# Patient Record
Sex: Male | Born: 1975 | Race: Black or African American | Hispanic: No | Marital: Single | Smoking: Former smoker
Health system: Southern US, Community
[De-identification: ages and names within clinical notes are randomized; demographics above are authoritative.]

## PROBLEM LIST (undated history)

## (undated) DIAGNOSIS — I10 Essential (primary) hypertension: Secondary | ICD-10-CM

## (undated) HISTORY — PX: DG THUMB LEFT HAND: HXRAD1658

---

## 2015-08-30 ENCOUNTER — Emergency Department (HOSPITAL_COMMUNITY)

## 2015-08-30 ENCOUNTER — Emergency Department (HOSPITAL_COMMUNITY)
Admission: EM | Admit: 2015-08-30 | Discharge: 2015-08-30 | Disposition: A | Discharge status: Home / Self Care | Attending: Emergency Medicine | Admitting: Emergency Medicine

## 2015-08-30 ENCOUNTER — Encounter (HOSPITAL_COMMUNITY): Payer: Self-pay | Admitting: Emergency Medicine

## 2015-08-30 DIAGNOSIS — S63251A Unspecified dislocation of left index finger, initial encounter: Secondary | ICD-10-CM | POA: Diagnosis not present

## 2015-08-30 DIAGNOSIS — Z87891 Personal history of nicotine dependence: Secondary | ICD-10-CM | POA: Diagnosis not present

## 2015-08-30 DIAGNOSIS — Y92310 Basketball court as the place of occurrence of the external cause: Secondary | ICD-10-CM | POA: Insufficient documentation

## 2015-08-30 DIAGNOSIS — Y998 Other external cause status: Secondary | ICD-10-CM | POA: Insufficient documentation

## 2015-08-30 DIAGNOSIS — W500XXA Accidental hit or strike by another person, initial encounter: Secondary | ICD-10-CM | POA: Insufficient documentation

## 2015-08-30 DIAGNOSIS — Y9367 Activity, basketball: Secondary | ICD-10-CM | POA: Insufficient documentation

## 2015-08-30 DIAGNOSIS — S6992XA Unspecified injury of left wrist, hand and finger(s), initial encounter: Secondary | ICD-10-CM | POA: Diagnosis present

## 2015-08-30 DIAGNOSIS — T148XXA Other injury of unspecified body region, initial encounter: Secondary | ICD-10-CM

## 2015-08-30 MED ORDER — CEFAZOLIN SODIUM 1-5 GM-% IV SOLN
1.0000 g | Freq: Three times a day (TID) | INTRAVENOUS | Status: DC
  Administered 2015-08-30: 1 g via INTRAVENOUS
  Filled 2015-08-30: qty 50

## 2015-08-30 MED ORDER — IBUPROFEN 600 MG PO TABS: 600.0000 mg | ORAL_TABLET | Freq: Four times a day (QID) | ORAL | Status: DC | PRN

## 2015-08-30 MED ORDER — HYDROCODONE-ACETAMINOPHEN 5-325 MG PO TABS: 1.0000 | ORAL_TABLET | ORAL | Status: DC | PRN

## 2015-08-30 MED ORDER — FENTANYL CITRATE (PF) 100 MCG/2ML IJ SOLN
50.0000 ug | Freq: Once | INTRAMUSCULAR | Status: AC
  Administered 2015-08-30: 50 ug via INTRAVENOUS

## 2015-08-30 MED ORDER — CEPHALEXIN 500 MG PO CAPS: 500.0000 mg | ORAL_CAPSULE | Freq: Four times a day (QID) | ORAL | Status: DC

## 2015-08-30 MED ORDER — FENTANYL CITRATE (PF) 100 MCG/2ML IJ SOLN
INTRAMUSCULAR | Status: AC
  Filled 2015-08-30: qty 2

## 2015-08-30 MED ORDER — BUPIVACAINE HCL (PF) 0.5 % IJ SOLN
10.0000 mL | Freq: Once | INTRAMUSCULAR | Status: DC
  Filled 2015-08-30: qty 30

## 2015-08-30 NOTE — Discharge Instructions (Signed)
Finger Dislocation °Finger dislocation is the displacement of bones in your finger at the joints. Most commonly, finger dislocation occurs at the proximal interphalangeal joint (the joint closest to your knuckle). Very strong, fibrous tissues (ligaments) and joint capsules connect the three bones of your fingers.  °CAUSES °Dislocation is caused by a forceful impact. This impact moves these bones off the joint and often tears your ligaments.  °SYMPTOMS °Symptoms of finger dislocation include: °· Deformity of your finger. °· Pain, with loss of movement. °DIAGNOSIS  °Finger dislocation is diagnosed with a physical exam. Often, X-ray exams are done to see if you have associated injuries, such as bone fractures. °TREATMENT  °Finger dislocations are treated by putting your bones back into position (reduction) either by manually moving the bones back into place or through surgery. Your finger is then kept in a fixed position (immobilized) with the use of a dressing or splint for a brief period. °When your ligament has to be surgically repaired, it needs to be kept in a fixed position with a dressing or splint for 1 to 2 weeks. Because joint stiffness is a long-term complication of finger dislocation, hand exercises or physical therapy to increase the range of motion and to regain strength is usually started as soon as the ligament is healed. Exercises and therapy generally last no more than 3 months. °HOME CARE INSTRUCTIONS °The following measures can help to reduce pain and speed up the healing process: °· Rest your injured joint. Do not move until instructed otherwise by your caregiver. Avoid activities similar to the one that caused your injury. °· Apply ice to your injured joint for the first day or 2 after your reduction or as directed by your caregiver. Applying ice helps to reduce inflammation and pain. °¨ Put ice in a plastic bag. °¨ Place a towel between your skin and the bag. °¨ Leave the ice on for 15-20 minutes  at a time, every 2 hours while you are awake. °· Elevate your hand above your heart as directed by your caregiver to reduce swelling. °· Take over-the-counter or prescription medicine for pain as your caregiver instructs you. °SEEK IMMEDIATE MEDICAL CARE IF: °· Your dressing or splint becomes damaged. °· Your pain becomes worse rather than better. °· You lose feeling in your finger, or it becomes cold and white. °MAKE SURE YOU: °· Understand these instructions. °· Will watch your condition. °· Will get help right away if you are not doing well or get worse. °Document Released: 12/01/2000 Document Revised: 02/26/2012 Document Reviewed: 09/24/2011 °[employer]® Patient Information ©2015 [employer]. This information is not intended to replace advice given to you by your health care provider. Make sure you discuss any questions you have with your health care provider. ° °Laceration Care, Adult °A laceration is a cut or lesion that goes through all layers of the skin and into the tissue just beneath the skin. °TREATMENT  °Some lacerations may not require closure. Some lacerations may not be able to be closed due to an increased risk of infection. It is important to see your caregiver as soon as possible after an injury to minimize the risk of infection and maximize the opportunity for successful closure. °If closure is appropriate, pain medicines may be given, if needed. The wound will be cleaned to help prevent infection. Your caregiver will use stitches (sutures), staples, wound glue (adhesive), or skin adhesive strips to repair the laceration. These tools bring the skin edges together to allow for faster healing and   a better cosmetic outcome. However, all wounds will heal with a scar. Once the wound has healed, scarring can be minimized by covering the wound with sunscreen during the day for 1 full year. °HOME CARE INSTRUCTIONS  °For sutures or staples: °· Keep the wound clean and dry. °· If you were given a bandage  (dressing), you should change it at least once a day. Also, change the dressing if it becomes wet or dirty, or as directed by your caregiver. °· Wash the wound with soap and water 2 times a day. Rinse the wound off with water to remove all soap. Pat the wound dry with a clean towel. °· After cleaning, apply a thin layer of the antibiotic ointment as recommended by your caregiver. This will help prevent infection and keep the dressing from sticking. °· You may shower as usual after the first 24 hours. Do not soak the wound in water until the sutures are removed. °· Only take over-the-counter or prescription medicines for pain, discomfort, or fever as directed by your caregiver. °· Get your sutures or staples removed as directed by your caregiver. °For skin adhesive strips: °· Keep the wound clean and dry. °· Do not get the skin adhesive strips wet. You may bathe carefully, using caution to keep the wound dry. °· If the wound gets wet, pat it dry with a clean towel. °· Skin adhesive strips will fall off on their own. You may trim the strips as the wound heals. Do not remove skin adhesive strips that are still stuck to the wound. They will fall off in time. °For wound adhesive: °· You may briefly wet your wound in the shower or bath. Do not soak or scrub the wound. Do not swim. Avoid periods of heavy perspiration until the skin adhesive has fallen off on its own. After showering or bathing, gently pat the wound dry with a clean towel. °· Do not apply liquid medicine, cream medicine, or ointment medicine to your wound while the skin adhesive is in place. This may loosen the film before your wound is healed. °· If a dressing is placed over the wound, be careful not to apply tape directly over the skin adhesive. This may cause the adhesive to be pulled off before the wound is healed. °· Avoid prolonged exposure to sunlight or tanning lamps while the skin adhesive is in place. Exposure to ultraviolet light in the first  year will darken the scar. °· The skin adhesive will usually remain in place for 5 to 10 days, then naturally fall off the skin. Do not pick at the adhesive film. °You may need a tetanus shot if: °· You cannot remember when you had your last tetanus shot. °· You have never had a tetanus shot. °If you get a tetanus shot, your arm may swell, get red, and feel warm to the touch. This is common and not a problem. If you need a tetanus shot and you choose not to have one, there is a rare chance of getting tetanus. Sickness from tetanus can be serious. °SEEK MEDICAL CARE IF:  °· You have redness, swelling, or increasing pain in the wound. °· You see a red line that goes away from the wound. °· You have yellowish-white fluid (pus) coming from the wound. °· You have a fever. °· You notice a bad smell coming from the wound or dressing. °· Your wound breaks open before or after sutures have been removed. °· You notice something coming out of   the wound such as wood or glass. °· Your wound is on your hand or foot and you cannot move a finger or toe. °SEEK IMMEDIATE MEDICAL CARE IF:  °· Your pain is not controlled with prescribed medicine. °· You have severe swelling around the wound causing pain and numbness or a change in color in your arm, hand, leg, or foot. °· Your wound splits open and starts bleeding. °· You have worsening numbness, weakness, or loss of function of any joint around or beyond the wound. °· You develop painful lumps near the wound or on the skin anywhere on your body. °MAKE SURE YOU:  °· Understand these instructions. °· Will watch your condition. °· Will get help right away if you are not doing well or get worse. °Document Released: 12/04/2005 Document Revised: 02/26/2012 Document Reviewed: 05/30/2011 °Exit[employer]® Patient Information ©2015 [employer]. This information is not intended to replace advice given to you by your health care provider. Make sure you discuss any questions you have with your health  care provider. ° °

## 2015-08-30 NOTE — ED Notes (Signed)
Pt playing basketball - struck finger of lt hand (pointer ) on another persons knee - has compund fx of finger -

## 2015-08-30 NOTE — ED Notes (Signed)
Report called to triage nurse via EDP, discharge instructions faxed to nurse by secretary. Instructions given to pt verbally by nurse. Discharge instructions given, pt demonstrated teach back and verbal understanding. No concerns voiced.

## 2015-08-30 NOTE — ED Provider Notes (Signed)
CSN: [id number]     Arrival date & time 08/30/15  1958 History   First MD Initiated Contact with Patient 08/30/15 2037     Chief Complaint  Patient presents with  . Finger Injury     (Consider location/radiation/quality/duration/timing/severity/associated sxs/prior Treatment) HPI Comments: 39 y.o. Male presents for left index finger injury.  The patient reports that he was playing basketball when he struck his finger against another player's knee.  Afterwards he found that the bone of the finger was exposed and he had horrible pain.  Denies other injury.   History reviewed. No pertinent past medical history. Past Surgical History  Procedure Laterality Date  . Dg thumb left hand      thumb surgery   History reviewed. No pertinent family history. Social History  Substance Use Topics  . Smoking status: Former Games developer  . Smokeless tobacco: Never Used  . Alcohol Use: Yes     Comment: none at the [facility]    Review of Systems  Constitutional: Negative for fatigue.  HENT: Negative for ear pain and nosebleeds.   Eyes: Negative for pain.  Cardiovascular: Negative for chest pain.  Musculoskeletal: Positive for joint swelling (swelling, disfigurement of the left index finger) and arthralgias (pain in the joint of the left index finger).  Skin: Positive for wound (laceration with protruding bone on the left index finger).  Neurological: Negative for numbness and headaches.  Hematological: Does not bruise/bleed easily.      Allergies  Review of patient's allergies indicates no known allergies.  Home Medications   Prior to Admission medications   Medication Sig Start Date End Date Taking? Authorizing Provider  cephALEXin (KEFLEX) 500 MG capsule Take 1 capsule (500 mg total) by mouth 4 (four) times daily. 08/30/15   Leta Baptist, MD  HYDROcodone-acetaminophen (NORCO) 5-325 MG per tablet Take 1 tablet by mouth every 4 (four) hours as needed for severe pain. 08/30/15   Leta Baptist, MD  ibuprofen (ADVIL,MOTRIN) 600 MG tablet Take 1 tablet (600 mg total) by mouth every 6 (six) hours as needed. 08/30/15   Leta Baptist, MD   BP 155/104 mmHg  Pulse 82  Temp(Src) 98.2 F (36.8 C) (Oral)  Resp 18  Ht 5\' 11"  (1.803 m)  Wt 197 lb (89.359 kg)  BMI 27.49 kg/m2  SpO2 100% Physical Exam  Constitutional: He appears well-developed and well-nourished. No distress.  HENT:  Head: Normocephalic and atraumatic.  Eyes: EOM are normal. Pupils are equal, round, and reactive to light.  Neck: Normal range of motion.  Cardiovascular: Normal rate, regular rhythm, normal heart sounds and intact distal pulses.   No murmur heard. Pulmonary/Chest: Effort normal. No respiratory distress. He has no wheezes. He has no rales.  Musculoskeletal:       Left hand: He exhibits decreased range of motion, tenderness, bony tenderness, deformity and laceration (over the area depicted in red on the diagram with protruding bone). He exhibits normal capillary refill. Normal sensation noted.       Hands: Skin: Laceration (1 cm along the anterior surface of the PIP joint of the left index finger) noted. He is not diaphoretic.  Vitals reviewed.   ED Course  Reduction of dislocation Date/Time: 08/30/2015 11:15 PM Performed by: Tyrone Apple ROE Authorized by: Leta Baptist Consent: Verbal consent obtained. Risks and benefits: risks, benefits and alternatives were discussed Consent given by: patient Patient understanding: patient states understanding of the procedure being performed Patient identity confirmed: verbally with patient Preparation: Patient  was prepped and draped in the usual sterile fashion. Local anesthesia used: yes Anesthesia: digital block Local anesthetic: bupivacaine 0.5% without epinephrine Patient sedated: no Patient tolerance: Patient tolerated the procedure well with no immediate complications Comments: Reduction of the PIP joint of the left finger  NERVE  BLOCK Date/Time: 08/30/2015 11:19 PM Performed by: Tyrone Apple ROE Authorized by: Leta Baptist Consent: Verbal consent obtained. Risks and benefits: risks, benefits and alternatives were discussed Consent given by: patient Patient understanding: patient states understanding of the procedure being performed Patient identity confirmed: verbally with patient Indications: pain relief and dislocation Body area: upper extremity Nerve: digital Laterality: left Patient sedated: no Preparation: Patient was prepped and draped in the usual sterile fashion. Patient position: sitting Needle gauge: 25 G Location technique: anatomical landmarks Local anesthetic: bupivacaine 0.5% without epinephrine Outcome: pain improved Patient tolerance: Patient tolerated the procedure well with no immediate complications   LACERATION REPAIR Performed by: Larna Daughters Authorized by: Larna Daughters Consent: Verbal consent obtained. Risks and benefits: risks, benefits and alternatives were discussed Consent given by: patient Patient identity confirmed: provided demographic data Prepped and Draped in normal sterile fashion Wound explored  Laceration Location: left index finger  Laceration Length: 1cm  No Foreign Bodies seen or palpated  Anesthesia: local infiltration  Local anesthetic: Bupivicaine 0.5%   Anesthetic total: 3 ml  Irrigation method: syringe Amount of cleaning: standard  Skin closure: 5-0 Ethilon  Number of sutures: 4  Technique: simple suture  Patient tolerance: Patient tolerated the procedure well with no immediate complications.    Labs Review Labs Reviewed - No data to display  Imaging Review Dg Finger Index Left  08/30/2015   CLINICAL DATA:  39 year old male status post reduction of the PIP dislocation of the index disease.  EXAM: LEFT INDEX FINGER 2+V  COMPARISON:  Radiograph dated 08/30/2015  FINDINGS: There has been interval reduction the previously seen PIP  dislocation of the index inferior. There is now anatomic alignment of the phalanges of the index finger. There is soft tissue swelling of the index finger.  IMPRESSION: Interval reduction of the previously seen PIP dislocation of the index finger with anatomic alignment.   Electronically Signed   By: Elgie Collard M.D.   On: 08/30/2015 22:37   Dg Finger Index Left  08/30/2015   CLINICAL DATA:  Injury to left index finger, Pt playing basketball - struck finger of lt hand (pointer ) on another persons knee - has compund fx of finger - no other history documented  EXAM: LEFT INDEX FINGER 2+V  COMPARISON:  None.  FINDINGS: The PIP joint of the left index finger is dislocated. The middle phalanx is dislocated posteriorly in relation to the proximal phalanx. The fractures also angulated in a radial direction. Middle phalanx is hyperextended in relation to the proximal phalangeal head.  No evidence of a fracture. There is mild associated soft tissue swelling.  IMPRESSION: Dislocated PIP joint of the left index finger.  No fracture.   Electronically Signed   By: Amie Portland M.D.   On: 08/30/2015 21:27   I have personally reviewed and evaluated these images and lab results as part of my medical decision-making.   EKG Interpretation None      MDM  Patient seen and evaluated in stable condition.  Dislocation reduced successfully without complication as detailed above and laceration was closed.  Patient up to date with  Tetanus.  He was given a dose of Kefzol.  Patient was placed in  a finger splint and discharged with instruction to follow up with ortho hand and with prescriptions for Kefzol, Norco, Motrin.  Patient expressed understanding and agreement with plan of care.  Final diagnoses:  Open dislocation    1. Left Index finger PIP dislocation  2. Left index finger laceration    Leta Baptist, MD 08/30/15 2323

## 2020-06-23 ENCOUNTER — Emergency Department (HOSPITAL_COMMUNITY)
Admission: EM | Admit: 2020-06-23 | Discharge: 2020-06-23 | Disposition: A | Discharge status: Home / Self Care | Attending: Emergency Medicine | Admitting: Emergency Medicine

## 2020-06-23 ENCOUNTER — Other Ambulatory Visit: Payer: Self-pay

## 2020-06-23 ENCOUNTER — Encounter (HOSPITAL_COMMUNITY): Payer: Self-pay | Admitting: *Deleted

## 2020-06-23 DIAGNOSIS — R197 Diarrhea, unspecified: Secondary | ICD-10-CM

## 2020-06-23 DIAGNOSIS — Z79899 Other long term (current) drug therapy: Secondary | ICD-10-CM | POA: Diagnosis not present

## 2020-06-23 DIAGNOSIS — Z87891 Personal history of nicotine dependence: Secondary | ICD-10-CM | POA: Insufficient documentation

## 2020-06-23 DIAGNOSIS — R1084 Generalized abdominal pain: Secondary | ICD-10-CM | POA: Insufficient documentation

## 2020-06-23 DIAGNOSIS — N179 Acute kidney failure, unspecified: Secondary | ICD-10-CM

## 2020-06-23 DIAGNOSIS — I10 Essential (primary) hypertension: Secondary | ICD-10-CM | POA: Insufficient documentation

## 2020-06-23 DIAGNOSIS — R1013 Epigastric pain: Secondary | ICD-10-CM | POA: Diagnosis present

## 2020-06-23 HISTORY — DX: Essential (primary) hypertension: I10

## 2020-06-23 LAB — COMPREHENSIVE METABOLIC PANEL
ALT: 21 U/L (ref 0–44)
AST: 23 U/L (ref 15–41)
Albumin: 4.6 g/dL (ref 3.5–5.0)
Alkaline Phosphatase: 46 U/L (ref 38–126)
Anion gap: 11 (ref 5–15)
BUN: 30 mg/dL — ABNORMAL HIGH (ref 6–20)
CO2: 24 mmol/L (ref 22–32)
Calcium: 9.1 mg/dL (ref 8.9–10.3)
Chloride: 99 mmol/L (ref 98–111)
Creatinine, Ser: 2.17 mg/dL — ABNORMAL HIGH (ref 0.61–1.24)
GFR calc Af Amer: 42 mL/min — ABNORMAL LOW (ref >60)
GFR calc non Af Amer: 36 mL/min — ABNORMAL LOW (ref >60)
Glucose, Bld: 99 mg/dL (ref 70–99)
Potassium: 3.8 mmol/L (ref 3.5–5.1)
Sodium: 134 mmol/L — ABNORMAL LOW (ref 135–145)
Total Bilirubin: 0.6 mg/dL (ref 0.3–1.2)
Total Protein: 8.2 g/dL — ABNORMAL HIGH (ref 6.5–8.1)

## 2020-06-23 LAB — LIPASE, BLOOD: Lipase: 19 U/L (ref 11–51)

## 2020-06-23 LAB — CBC
HCT: 41.9 % (ref 39.0–52.0)
Hemoglobin: 14 g/dL (ref 13.0–17.0)
MCH: 28.9 pg (ref 26.0–34.0)
MCHC: 33.4 g/dL (ref 30.0–36.0)
MCV: 86.6 fL (ref 80.0–100.0)
Platelets: 271 10*3/uL (ref 150–400)
RBC: 4.84 MIL/uL (ref 4.22–5.81)
RDW: 12.3 % (ref 11.5–15.5)
WBC: 6.2 10*3/uL (ref 4.0–10.5)
nRBC: 0 % (ref 0.0–0.2)

## 2020-06-23 LAB — URINALYSIS, ROUTINE W REFLEX MICROSCOPIC
Bilirubin Urine: NEGATIVE
Glucose, UA: NEGATIVE mg/dL
Hgb urine dipstick: NEGATIVE
Ketones, ur: NEGATIVE mg/dL
Leukocytes,Ua: NEGATIVE
Nitrite: NEGATIVE
Protein, ur: NEGATIVE mg/dL
Specific Gravity, Urine: 1.003 — ABNORMAL LOW (ref 1.005–1.030)
pH: 5 (ref 5.0–8.0)

## 2020-06-23 MED ORDER — SODIUM CHLORIDE 0.9 % IV BOLUS
1000.0000 mL | Freq: Once | INTRAVENOUS | Status: AC
  Administered 2020-06-23: 1000 mL via INTRAVENOUS

## 2020-06-23 MED ORDER — SODIUM CHLORIDE 0.9% FLUSH
3.0000 mL | Freq: Once | INTRAVENOUS | Status: AC
  Administered 2020-06-23: 3 mL via INTRAVENOUS

## 2020-06-23 MED ORDER — FAMOTIDINE IN NACL 20-0.9 MG/50ML-% IV SOLN
20.0000 mg | Freq: Once | INTRAVENOUS | Status: AC
  Administered 2020-06-23: 20 mg via INTRAVENOUS
  Filled 2020-06-23: qty 50

## 2020-06-23 MED ORDER — PANTOPRAZOLE SODIUM 20 MG PO TBEC: 20.0000 mg | DELAYED_RELEASE_TABLET | Freq: Every day | ORAL | 0 refills | Status: AC

## 2020-06-23 NOTE — Discharge Instructions (Signed)
You were seen in the emergency department for abdominal pain and loose stools.  Your lab work was fairly normal other than your creatinine was elevated at 2.17.  We do not have any old prior values for you.  We consulted our kidney specialist and they recommended you stopping your lisinopril, indomethacin, and diuretic.  We also think you should stop your terbinafine tablets.  You will need repeat blood work done in the next few days to make sure your creatinine is improving.  Please drink plenty of fluids.  Return to the emergency department if any worsening or concerning symptoms.

## 2020-06-23 NOTE — ED Triage Notes (Signed)
Pt states he did not take it this morning and diarrhea has improved.

## 2020-06-23 NOTE — ED Provider Notes (Signed)
[facility] EMERGENCY DEPARTMENT Provider Note   CSN: [id number] Arrival date & time: 06/23/20  1546     History Chief Complaint  Patient presents with  . Abdominal Pain    Jonathan Francis is a 44 y.o. male.  He is an inmate at the [facility] facility.  He is complaining of a week of upset stomach poor appetite and at least 2 or 3 stools a day that are loose.  He said he is wiping so much that is irritating his backside and noticing a little bit of bleeding.  Not stooling frank blood.  No fevers.  He said since he has been at the facility they have been adjusting his blood pressure medicine and also have him on Lamisil oral for tinea on his feet.  Also on indomethacin which is a new medicine for him.  No recent antibiotics.  No sick contacts.  The history is provided by the patient.  Abdominal Pain Pain location:  Epigastric Pain quality: burning   Pain radiates to:  Does not radiate Pain severity:  Moderate Onset quality:  Gradual Duration:  1 week Timing:  Intermittent Progression:  Unchanged Chronicity:  New Context: not sick contacts and not trauma   Relieved by:  Nothing Worsened by:  Nothing Ineffective treatments:  None tried Associated symptoms: diarrhea and nausea   Associated symptoms: no chest pain, no chills, no cough, no dysuria, no fever, no hematemesis, no hematuria, no shortness of breath, no sore throat and no vomiting        Past Medical History:  Diagnosis Date  . Hypertension     There are no problems to display for this patient.   Past Surgical History:  Procedure Laterality Date  . DG THUMB LEFT HAND     thumb surgery       History reviewed. No pertinent family history.  Social History   Tobacco Use  . Smoking status: Former Games developer  . Smokeless tobacco: Never Used  Substance Use Topics  . Alcohol use: Yes    Comment: none at the [facility]  . Drug use: Yes    Types: Marijuana    Comment: not at this time     Home Medications Prior  to Admission medications   Medication Sig Start Date End Date Taking? Authorizing Provider  cephALEXin (KEFLEX) 500 MG capsule Take 1 capsule (500 mg total) by mouth 4 (four) times daily. 08/30/15   Leta Baptist, MD  HYDROcodone-acetaminophen [employer]) 5-325 MG per tablet Take 1 tablet by mouth every 4 (four) hours as needed for severe pain. 08/30/15   Leta Baptist, MD  ibuprofen (ADVIL,MOTRIN) 600 MG tablet Take 1 tablet (600 mg total) by mouth every 6 (six) hours as needed. 08/30/15   Leta Baptist, MD    Allergies    Patient has no known allergies.  Review of Systems   Review of Systems  Constitutional: Negative for chills and fever.  HENT: Negative for sore throat.   Eyes: Negative for visual disturbance.  Respiratory: Negative for cough and shortness of breath.   Cardiovascular: Negative for chest pain.  Gastrointestinal: Positive for abdominal pain, diarrhea and nausea. Negative for hematemesis and vomiting.  Genitourinary: Negative for dysuria and hematuria.  Musculoskeletal: Negative for neck pain.  Skin: Negative for rash.  Neurological: Negative for headaches.    Physical Exam Updated Vital Signs BP 115/89 (BP Location: Right Arm)   Pulse 62   Temp 97.9 F (36.6 C) (Oral)   Resp 18  Ht 6' (1.829 m)   Wt 98.9 kg   SpO2 100%   BMI 29.57 kg/m   Physical Exam Vitals and nursing note reviewed.  Constitutional:      Appearance: He is well-developed.  HENT:     Head: Normocephalic and atraumatic.  Eyes:     Conjunctiva/sclera: Conjunctivae normal.  Cardiovascular:     Rate and Rhythm: Normal rate and regular rhythm.     Heart sounds: No murmur heard.   Pulmonary:     Effort: Pulmonary effort is normal. No respiratory distress.     Breath sounds: Normal breath sounds.  Abdominal:     Palpations: Abdomen is soft.     Tenderness: There is no abdominal tenderness. There is no guarding or rebound.  Musculoskeletal:        General: No deformity or signs  of injury. Normal range of motion.     Cervical back: Neck supple.  Skin:    General: Skin is warm and dry.     Capillary Refill: Capillary refill takes less than 2 seconds.  Neurological:     General: No focal deficit present.     Mental Status: He is alert.     Gait: Gait normal.     ED Results / Procedures / Treatments   Labs (all labs ordered are listed, but only abnormal results are displayed) Labs Reviewed  COMPREHENSIVE METABOLIC PANEL - Abnormal; Notable for the following components:      Result Value   Sodium 134 (*)    BUN 30 (*)    Creatinine, Ser 2.17 (*)    Total Protein 8.2 (*)    GFR calc non Af Amer 36 (*)    GFR calc Af Amer 42 (*)    All other components within normal limits  URINALYSIS, ROUTINE W REFLEX MICROSCOPIC - Abnormal; Notable for the following components:   Color, Urine COLORLESS (*)    Specific Gravity, Urine 1.003 (*)    All other components within normal limits  LIPASE, BLOOD  CBC    EKG None  Radiology No results found.  Procedures Procedures (including critical care time)  Medications Ordered in ED Medications  sodium chloride flush (NS) 0.9 % injection 3 mL (3 mLs Intravenous Given 06/23/20 1851)  sodium chloride 0.9 % bolus 1,000 mL (0 mLs Intravenous Stopped 06/23/20 2029)  famotidine (PEPCID) IVPB 20 mg premix (0 mg Intravenous Stopped 06/23/20 2000)    ED Course  I have reviewed the triage vital signs and the nursing notes.  Pertinent labs & imaging results that were available during my care of the patient were reviewed by me and considered in my medical decision making (see chart for details).  Clinical Course as of Jun 25 1111  Wed Jun 23, 2020  2993 Reviewed the patient's new medications.  Clearly the indomethacin can cause him to have an upset stomach and some gastritis.  Also the terbinafine these are common side effects and can also cause abnormal LFTs.  Reviewed with patient.   [MB]  1938 Patient's creatinine is 2.17.  I  asked him if he is ever been told it was abnormal and he said no.  He said he had hypertension for about 5 years and that about a month ago his blood pressure was becoming more elevated and so they started adjusting his medications.  I called the [facility] but because he is out of the system at the hospital they are unable to open his record and find out  if he has had any prior labs.   [MB]  2017 Discussed with nephrology Dr. Ronalee Belts.  He recommends discontinuing the lisinopril and the diuretic and the indomethacin and having the patient get repeat labs to back down at his facility.  I reviewed this with the patient he is comfortable plan.  We will also start him on a PPI and have him stop the terbinafine.   [MB]    Clinical Course User Index [MB] Terrilee Files, MD   MDM Rules/Calculators/A&P                         This patient complains of upset stomach, nausea, loose stools; this involves an extensive number of treatment Options and is a complaint that carries with it a high risk of complications and Morbidity. The differential includes gastritis, peptic ulcer disease, gastroenteritis, metabolic derangement, dehydration  I ordered, reviewed and interpreted labs, which included normal white count and hemoglobin, chemistries with elevated creatinine of 2.17 and elevated BUN, normal LFTs, negative urine there are no old lab values in the system to figure out what his baseline creatinine is I ordered medication IV fluids and Pepcid with improvement in his symptoms  Additional history obtained from cards that are with him and I called the facility to speak to the facility nurse Previous records obtained and reviewed in epic, none I consulted Dr. Ronalee Belts nephrology and discussed labs. He recommended stopping the nephrotoxic medications and having him have close follow-up with repeat labs. Reviewed this with the patient and he is comfortable with plan.  Critical Interventions:  None  After the interventions stated above, I reevaluated the patient and found patient to be hemodynamically stable. Have made some adjustments in his medications and will put him on a PPI. Return instructions discussed  Final Clinical Impression(s) / ED Diagnoses Final diagnoses:  AKI (acute kidney injury) (HCC)  Generalized abdominal pain  Diarrhea, unspecified type    Rx / DC Orders ED Discharge Orders         Ordered    pantoprazole (PROTONIX) 20 MG tablet  Daily     Discontinue  Reprint     06/23/20 2020           Terrilee Files, MD 06/24/20 1115

## 2020-06-23 NOTE — ED Triage Notes (Signed)
Pt with abd pain with diarrhea for a week.  States he is on Lamisil for a month. Pt believes when he takes other medication, upsets his stomach.

## 2020-10-28 ENCOUNTER — Emergency Department (HOSPITAL_COMMUNITY)

## 2020-10-28 ENCOUNTER — Emergency Department (HOSPITAL_COMMUNITY)
Admission: EM | Admit: 2020-10-28 | Discharge: 2020-10-28 | Disposition: A | Discharge status: Home / Self Care | Attending: Emergency Medicine | Admitting: Emergency Medicine

## 2020-10-28 ENCOUNTER — Encounter (HOSPITAL_COMMUNITY): Payer: Self-pay | Admitting: *Deleted

## 2020-10-28 ENCOUNTER — Other Ambulatory Visit: Payer: Self-pay

## 2020-10-28 DIAGNOSIS — S8991XA Unspecified injury of right lower leg, initial encounter: Secondary | ICD-10-CM | POA: Diagnosis present

## 2020-10-28 DIAGNOSIS — T148XXA Other injury of unspecified body region, initial encounter: Secondary | ICD-10-CM

## 2020-10-28 DIAGNOSIS — Z87891 Personal history of nicotine dependence: Secondary | ICD-10-CM | POA: Insufficient documentation

## 2020-10-28 DIAGNOSIS — W2101XA Struck by football, initial encounter: Secondary | ICD-10-CM | POA: Insufficient documentation

## 2020-10-28 DIAGNOSIS — S86911A Strain of unspecified muscle(s) and tendon(s) at lower leg level, right leg, initial encounter: Secondary | ICD-10-CM | POA: Insufficient documentation

## 2020-10-28 DIAGNOSIS — I10 Essential (primary) hypertension: Secondary | ICD-10-CM | POA: Insufficient documentation

## 2020-10-28 DIAGNOSIS — Y9361 Activity, american tackle football: Secondary | ICD-10-CM | POA: Insufficient documentation

## 2020-10-28 DIAGNOSIS — I82401 Acute embolism and thrombosis of unspecified deep veins of right lower extremity: Secondary | ICD-10-CM | POA: Diagnosis not present

## 2020-10-28 DIAGNOSIS — Z79899 Other long term (current) drug therapy: Secondary | ICD-10-CM | POA: Insufficient documentation

## 2020-10-28 MED ORDER — ACETAMINOPHEN 325 MG PO TABS
650.0000 mg | ORAL_TABLET | Freq: Once | ORAL | Status: AC
  Administered 2020-10-28: 650 mg via ORAL
  Filled 2020-10-28: qty 2

## 2020-10-28 NOTE — ED Notes (Signed)
Ice pack to right calf

## 2020-10-28 NOTE — Discharge Instructions (Addendum)
Mr. Jonathan Francis was evaluated in the emergency department today for pain in his right calf after injury playing basketball.  X-ray of his right leg is without broken bone or dislocation.  Ultrasound of your right leg does not show any blood clots.  This is very good news.  It is likely that you have a tear in the muscle of your calf.  You may utilize Tylenol or ibuprofen at home for your discomfort.  It will be important to keep your calf compressed using an Ace wrap for the next several days.  You may ice the leg for 15 to 20 minutes at a time, 4-5 times daily to help with the inflammation.  You may use your crutches to stay off of your leg, and you should keep your leg elevated when you're sitting down or lying in bed.  Below is the contact information for Dr. Dallas Schimke, an orthopedic doctor who specializes in muscles and bones.  You need to schedule an appointment to see him within the next 10 to 14 days for reevaluation of your leg.  Please call his office and communicate with them that you need to be seen for emergency department follow-up appointment.  Please return the emergency department immediately if your calf becomes firm to the touch, your pain worsens severely, your foot becomes numb, tingly, or cool to the touch, or if you are losing function in your foot - as these could be signs of in issue called "compartment syndrome" which requires emergent evaluation and treatment.

## 2020-10-28 NOTE — ED Provider Notes (Signed)
[facility] EMERGENCY DEPARTMENT Provider Note   CSN: [id number] Arrival date & time: 10/28/20  1539     History Chief Complaint  Patient presents with  . Leg Pain    Jonathan Francis is a 44 y.o. male who presents from a facility after injury to the right calf.  He presents from the [facility] work facility. Facility security guard present at the bedside.   He states that was kicked in the calf while playing basketball, worsening pain and swelling in the calf since that time. Endorses walking with a limp, due to worsening pain when bearing weight on the leg.  States pain is in posterior + medial calf, does not radiate up into his knee and thigh or down to his foot.  He denies numbness or tingling in the foot.  Endorses prior injury to the right knee and right ankle, but never injured his calf.  Patient has not on any anticoagulation.  Denies history of DVT/PE, denies history of bleeding or clotting disorder.  Reviewed patient's medical records.  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  HPI     Past Medical History:  Diagnosis Date  . Hypertension    There are no problems to display for this patient.  Past Surgical History:  Procedure Laterality Date  . DG THUMB LEFT HAND     thumb surgery    No family history on file.  Social History   Tobacco Use  . Smoking status: Former Games developer  . Smokeless tobacco: Never Used  Substance Use Topics  . Alcohol use: Yes    Comment: none at the [facility]  . Drug use: Yes    Types: Marijuana    Comment: not at this time     Home Medications Prior to Admission medications   Medication Sig Start Date End Date Taking? Authorizing Provider  chlorthalidone (HYGROTON) 25 MG tablet Take 25 mg by mouth daily.  06/23/20  [provider]  lisinopril (ZESTRIL) 20 MG tablet Take 20 mg by mouth daily.  06/23/20  [provider]  amLODipine (NORVASC) 10 MG  tablet Take 10 mg by mouth daily.    [provider]  pantoprazole (PROTONIX) 20 MG tablet Take 1 tablet (20 mg total) by mouth daily. 06/23/20   Terrilee Files, MD  terbinafine (LAMISIL) 1 % cream Apply 1 application topically at bedtime.    [provider]  tolnaftate (TINACTIN) 1 % powder Apply 1 application topically at bedtime.    [provider]    Allergies    Patient has no known allergies.  Review of Systems   Review of Systems  Constitutional: Negative.   HENT: Negative.   Respiratory: Negative.   Cardiovascular: Positive for leg swelling. Negative for chest pain and palpitations.       Swelling of right calf since injury that occurred at 230 this afternoon.  Musculoskeletal: Positive for myalgias.  Skin: Negative.   Allergic/Immunologic: Negative.   Neurological: Negative.   Hematological: Negative.   Psychiatric/Behavioral: Negative.     Physical Exam Updated Vital Signs BP 121/87   Pulse 83   Temp 98.2 F (36.8 C)   Resp 20   Ht 6' (1.829 m)   Wt 93.9 kg   SpO2 98%   BMI 28.07 kg/m   Physical Exam Vitals and nursing note reviewed.  HENT:     Head: Normocephalic and atraumatic.     Mouth/Throat:  Mouth: Mucous membranes are moist.     Pharynx: No oropharyngeal exudate or posterior oropharyngeal erythema.  Eyes:     General:        Right eye: No discharge.        Left eye: No discharge.     Conjunctiva/sclera: Conjunctivae normal.  Cardiovascular:     Rate and Rhythm: Normal rate and regular rhythm.     Pulses: Normal pulses.          Radial pulses are 2+ on the right side and 2+ on the left side.       Dorsalis pedis pulses are 2+ on the right side and 2+ on the left side.     Heart sounds: Normal heart sounds. No murmur heard.      Comments: Edema no edema painful Pulmonary:     Effort: Pulmonary effort is normal. No respiratory distress.     Breath sounds: Normal breath sounds. No wheezing or rales.  Abdominal:       General: Bowel sounds are normal. There is no distension.     Tenderness: There is no abdominal tenderness.  Musculoskeletal:        General: No deformity.     Cervical back: Neck supple.     Right upper leg: Normal.     Left upper leg: Normal.     Right knee: Normal.     Left knee: Normal.     Right lower leg: Swelling and tenderness present. No bony tenderness. No edema.     Left lower leg: Normal. No edema.     Right ankle: Normal.     Right Achilles Tendon: Normal. No tenderness.     Left ankle: Normal.     Left Achilles Tendon: Normal.     Right foot: Normal. Normal capillary refill. No crepitus. Normal pulse.     Left foot: Normal. Normal capillary refill. No crepitus. Normal pulse.       Legs:     Comments: No warmth to touch over the calf.  Skin:    General: Skin is warm and dry.     Capillary Refill: Capillary refill takes less than 2 seconds.  Neurological:     General: No focal deficit present.     Mental Status: He is alert and oriented to person, place, and time. Mental status is at baseline.  Psychiatric:        Mood and Affect: Mood normal.     ED Results / Procedures / Treatments   Labs (all labs ordered are listed, but only abnormal results are displayed) Labs Reviewed - No data to display  EKG None  Radiology DG Tibia/Fibula Right  Result Date: 10/28/2020 CLINICAL DATA:  Right calf pain, basketball injury EXAM: RIGHT TIBIA AND FIBULA - 2 VIEW COMPARISON:  None. FINDINGS: Frontal and lateral views of the right tibia and fibula are obtained. No acute fracture. Alignment of the right knee and ankle is anatomic. Significant enthesopathic changes are seen of the patella. There is mild osteoarthritis of the tibiotalar joint. Significant osteoarthritis is seen at the tarsometatarsal joints. Prominent enthesopathic changes of the calcaneus are noted. Soft tissues are unremarkable. IMPRESSION: 1. Multifocal osteoarthritis.  No acute fracture. Electronically  Signed   By: Sharlet Salina M.D.   On: 10/28/2020 18:05   US Venous Img Lower Right (DVT Study)  Result Date: 10/28/2020 CLINICAL DATA:  RIGHT calf pain. EXAM: RIGHT LOWER EXTREMITY VENOUS DOPPLER ULTRASOUND TECHNIQUE: Gray-scale sonography with compression, as well as color and  duplex ultrasound, were performed to evaluate the deep venous system(s) from the level of the common femoral vein through the popliteal and proximal calf veins. COMPARISON:  None. FINDINGS: VENOUS Normal compressibility of the common femoral, superficial femoral, and popliteal veins, as well as the visualized calf veins. Visualized portions of profunda femoral vein and great saphenous vein unremarkable. No filling defects to suggest DVT on grayscale or color Doppler imaging. Doppler waveforms show normal direction of venous flow, normal respiratory plasticity and response to augmentation. Limited views of the contralateral common femoral vein are unremarkable. OTHER None. Limitations: none IMPRESSION: No RIGHT lower extremity deep venous thrombosis. Electronically Signed   By: Genevive Bi M.D.   On: 10/28/2020 17:22    Procedures Procedures (including critical care time)  Medications Ordered in ED Medications  acetaminophen (TYLENOL) tablet 650 mg (650 mg Oral Given 10/28/20 1645)    ED Course  I have reviewed the triage vital signs and the nursing notes.  Pertinent labs & imaging results that were available during my care of the patient were reviewed by me and considered in my medical decision making (see chart for details).    MDM Rules/Calculators/A&P                         44 year old male who presents with right calf pain and swelling since getting kicked during a basketball game this afternoon.   Vital signs normal on intake.  Physical exam concerning for obvious edema of the right calf, with tenderness to palpation.  Right leg neurovascularly intact.  We will proceed with plain film of the tibia  and fibula, ultrasound of the right leg.  Tylenol for pain.  Plain film of the right tibia and fibula with multifocal osteoarthritis, but without acute fracture.  Venous Doppler ultrasound of the right lower leg without lower extremity DVT.  This patient likely has muscular injury such as tear of the right gastrocnemius.  Given reassuring physical exam, patient neurovascularly intact in the right leg and foot, as well as reassuring imaging studies  - I do not feel any further work-up is necessary in the emergency department at this time.  Recommend rest, ice, compression, elevation.  Patient may continue to take Tylenol or ibuprofen at home for his pain.    Recommend follow-up with orthopedic provider within 2 weeks, for reevaluation.  Will apply Ace bandage, provide patient with crutches prior to discharge.  Facility security guard who is at the bedside confirmed that the patient will be able to utilize crutches within his facility.    Mr. Dunshee voiced understanding of his medical evaluation and treatment plan.  Each of his questions were answered to his expressed satisfaction.  Strict return precautions were given, specifically regarding compartment syndrome.  Patient stable for discharge.  Final Clinical Impression(s) / ED Diagnoses Final diagnoses:  Muscle strain    Rx / DC Orders ED Discharge Orders    None       Sherrilee Gilles 10/28/20 1949    Maia Plan, MD 10/29/20 [phone]

## 2020-10-28 NOTE — ED Triage Notes (Signed)
Pain in right calf, states leg was injured playing basketball today. States someone kicked him in the leg

## 2022-02-27 IMAGING — US US EXTREM LOW VENOUS*R*
1 series · 14 of 24 positions shown · non-contrast
Comparison: None.

CLINICAL DATA: RIGHT calf pain.

EXAM:
RIGHT LOWER EXTREMITY VENOUS DOPPLER ULTRASOUND
TECHNIQUE: Gray-scale sonography with compression, as well as color and duplex
ultrasound, were performed to evaluate the deep venous system(s)
from the level of the common femoral vein through the popliteal and
proximal calf veins.

[Series 1: us venous img lower uni right (dvt) · portal-venous · 14 of 35 slices shown]
[im 1/35]
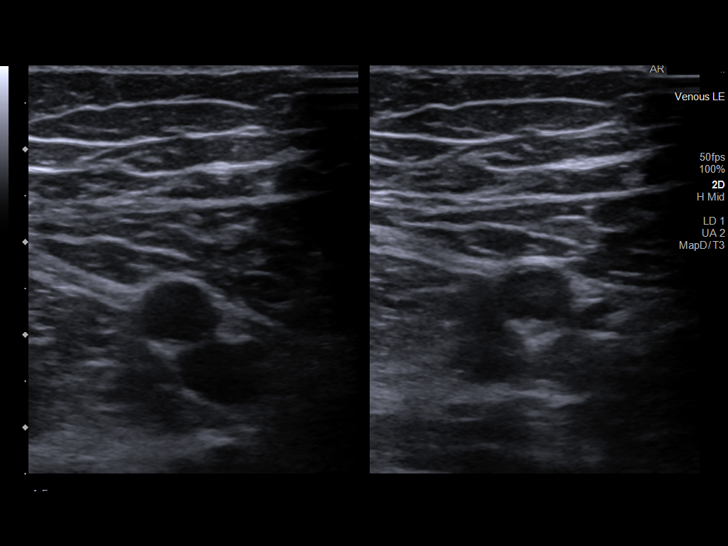
[im 3/35]
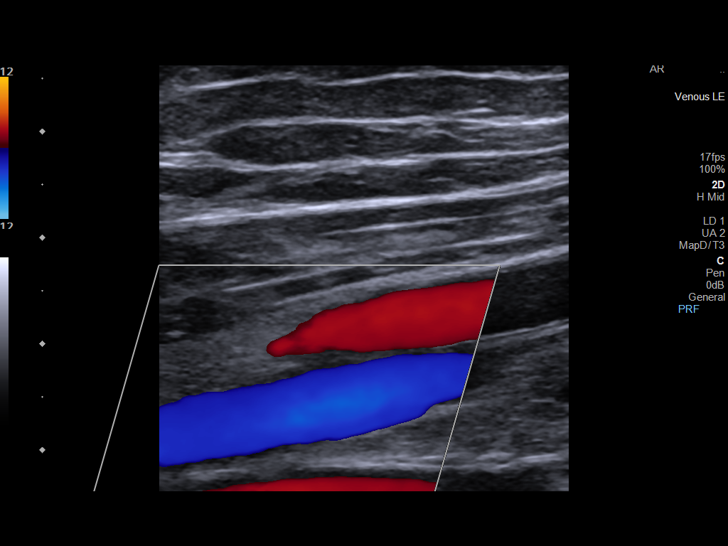
[im 6/35]
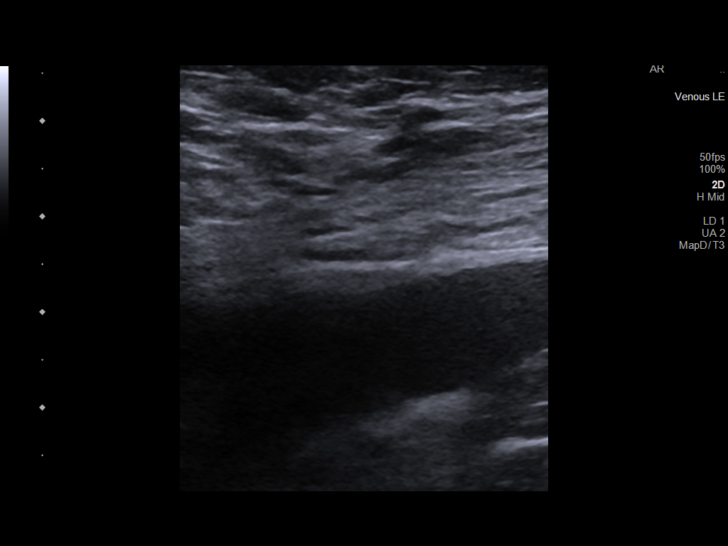
[im 9/35]
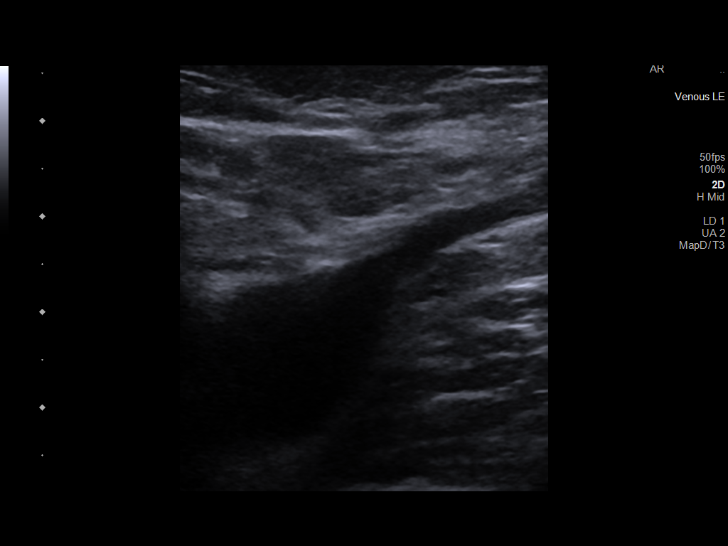
[im 11/35]
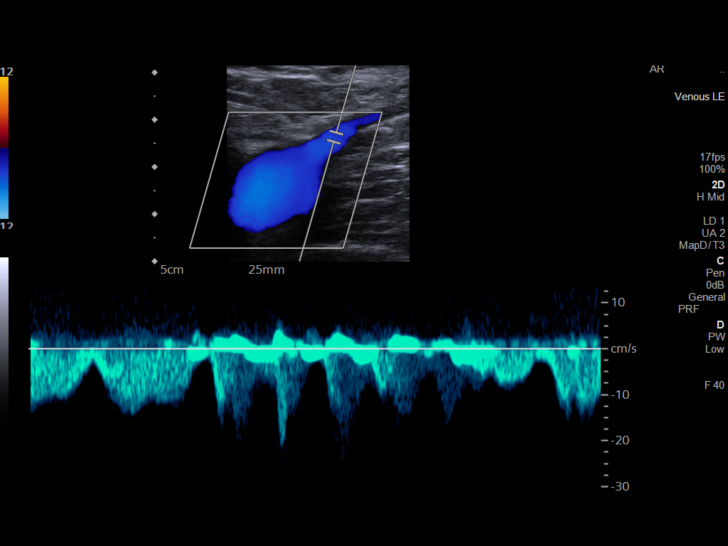
[im 14/35]
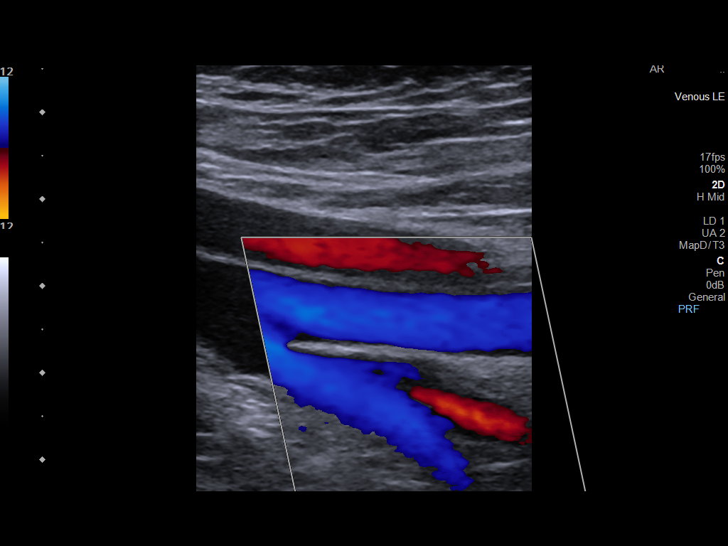
[im 17/35]
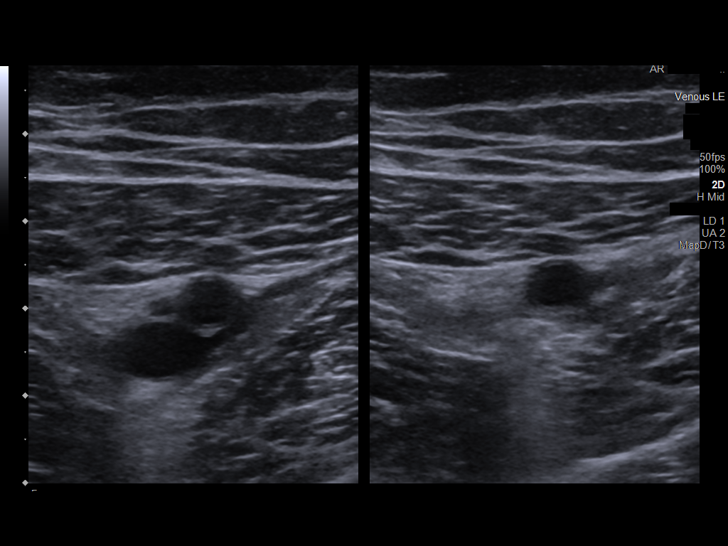
[im 18/35]
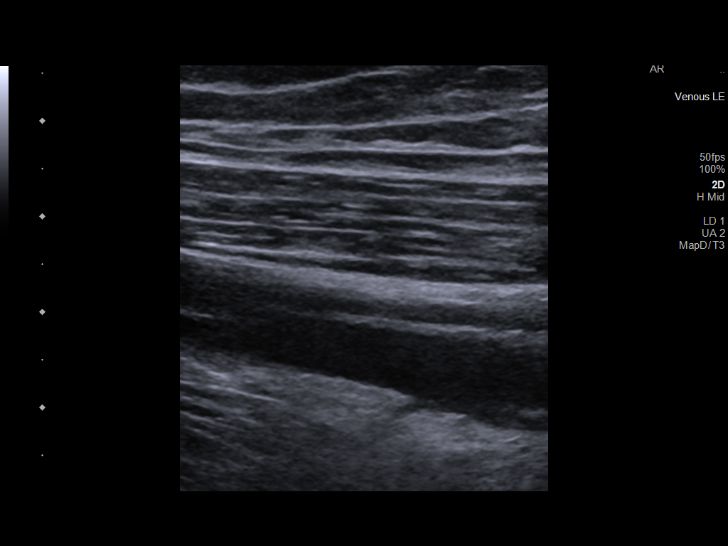
[im 21/35]
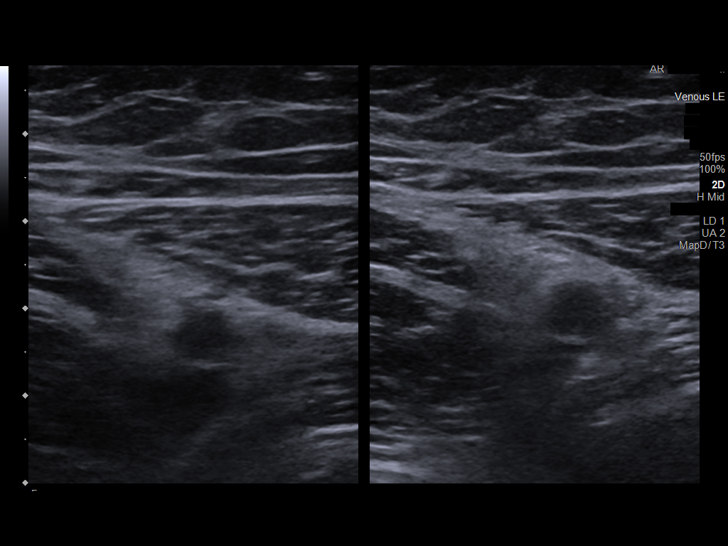
[im 24/35]
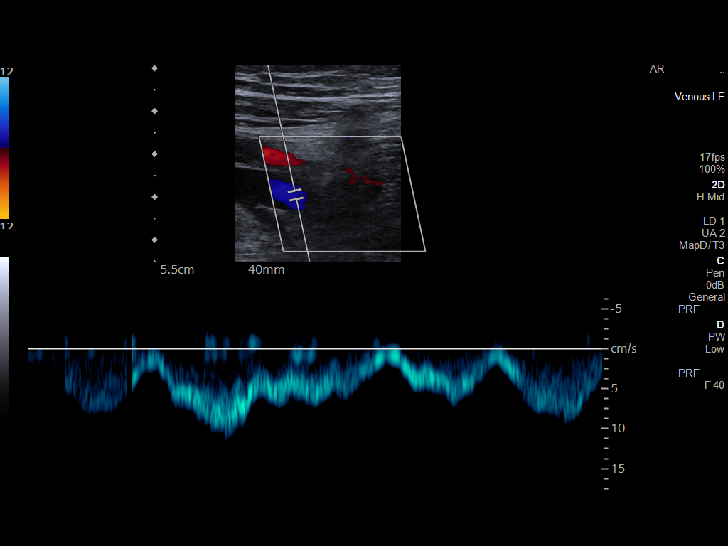
[im 27/35]
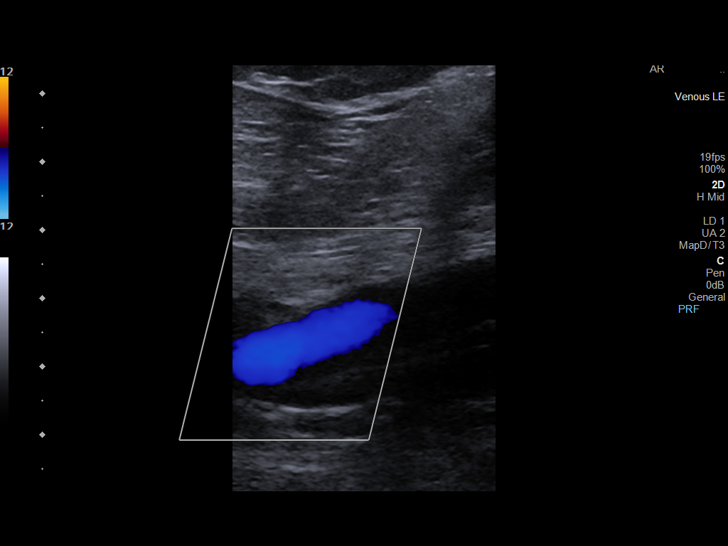
[im 29/35]
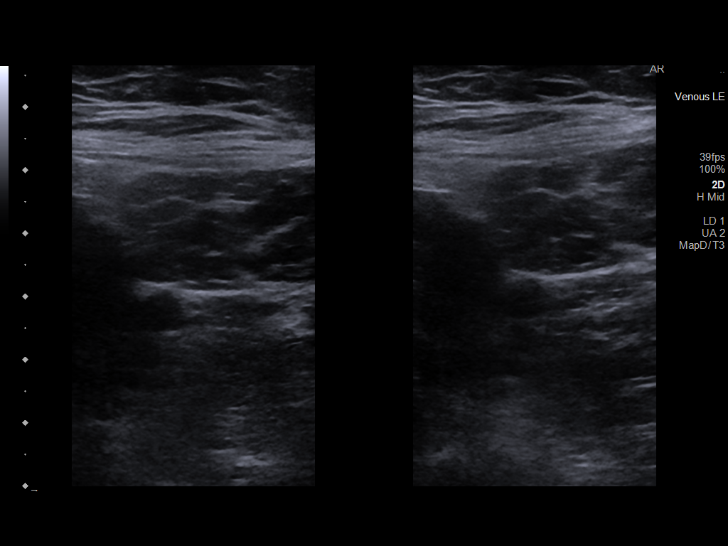
[im 32/35]
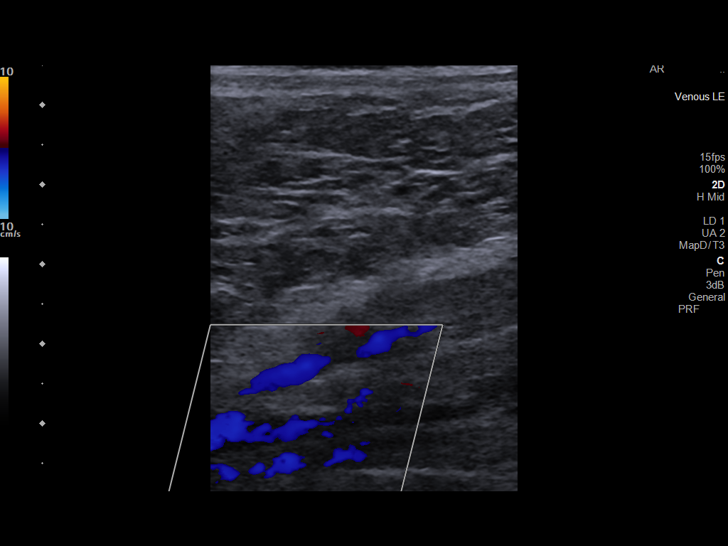
[im 35/35]
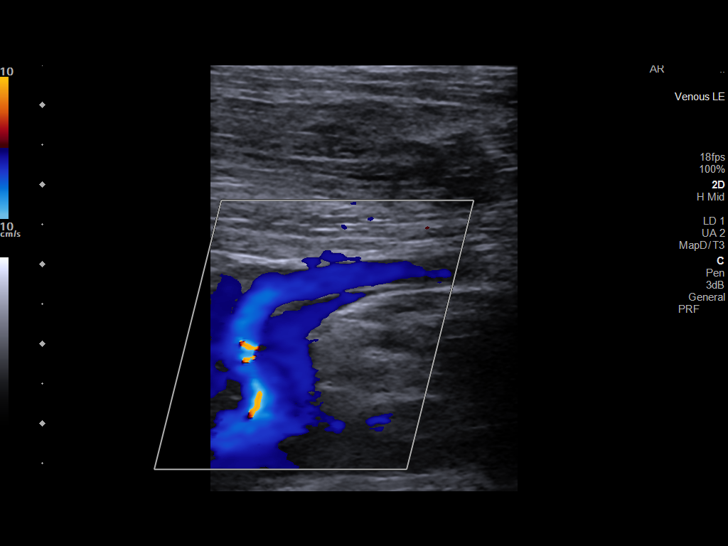

[14 of 24 positions shown; findings below may reference images not displayed]

FINDINGS: VENOUS

Normal compressibility of the common femoral, superficial femoral,
and popliteal veins, as well as the visualized calf veins.
Visualized portions of profunda femoral vein and great saphenous
vein unremarkable. No filling defects to suggest DVT on grayscale or
color Doppler imaging. Doppler waveforms show normal direction of
venous flow, normal respiratory plasticity and response to
augmentation.

Limited views of the contralateral common femoral vein are
unremarkable.

OTHER

None.

Limitations: none
IMPRESSION: No RIGHT lower extremity deep venous thrombosis.
